# Patient Record
Sex: Male | Born: 1960 | Race: Black or African American | Hispanic: No | Marital: Single | Smoking: Current every day smoker
Health system: Southern US, Community
[De-identification: ages and names within clinical notes are randomized; demographics above are authoritative.]

---

## 2019-07-12 ENCOUNTER — Other Ambulatory Visit: Payer: Self-pay

## 2019-07-12 ENCOUNTER — Emergency Department (HOSPITAL_COMMUNITY)
Admission: EM | Admit: 2019-07-12 | Discharge: 2019-07-13 | Disposition: A | Discharge status: Home / Self Care | Payer: Self-pay | Attending: Emergency Medicine | Admitting: Emergency Medicine

## 2019-07-12 ENCOUNTER — Emergency Department (HOSPITAL_COMMUNITY): Payer: Self-pay

## 2019-07-12 ENCOUNTER — Encounter (HOSPITAL_COMMUNITY): Payer: Self-pay | Admitting: Emergency Medicine

## 2019-07-12 DIAGNOSIS — F17200 Nicotine dependence, unspecified, uncomplicated: Secondary | ICD-10-CM | POA: Insufficient documentation

## 2019-07-12 DIAGNOSIS — R55 Syncope and collapse: Secondary | ICD-10-CM

## 2019-07-12 DIAGNOSIS — R05 Cough: Secondary | ICD-10-CM

## 2019-07-12 DIAGNOSIS — Z20828 Contact with and (suspected) exposure to other viral communicable diseases: Secondary | ICD-10-CM | POA: Insufficient documentation

## 2019-07-12 DIAGNOSIS — J069 Acute upper respiratory infection, unspecified: Secondary | ICD-10-CM | POA: Insufficient documentation

## 2019-07-12 DIAGNOSIS — N390 Urinary tract infection, site not specified: Secondary | ICD-10-CM | POA: Insufficient documentation

## 2019-07-12 DIAGNOSIS — N189 Chronic kidney disease, unspecified: Secondary | ICD-10-CM | POA: Insufficient documentation

## 2019-07-12 LAB — BASIC METABOLIC PANEL
Anion gap: 8 (ref 5–15)
BUN: 35 mg/dL — ABNORMAL HIGH (ref 6–20)
CO2: 22 mmol/L (ref 22–32)
Calcium: 9.1 mg/dL (ref 8.9–10.3)
Chloride: 107 mmol/L (ref 98–111)
Creatinine, Ser: 3.12 mg/dL — ABNORMAL HIGH (ref 0.61–1.24)
GFR calc Af Amer: 25 mL/min — ABNORMAL LOW (ref >60)
GFR calc non Af Amer: 21 mL/min — ABNORMAL LOW (ref >60)
Glucose, Bld: 91 mg/dL (ref 70–99)
Potassium: 4.8 mmol/L (ref 3.5–5.1)
Sodium: 137 mmol/L (ref 135–145)

## 2019-07-12 LAB — CBC
HCT: 36.3 % — ABNORMAL LOW (ref 39.0–52.0)
Hemoglobin: 11.7 g/dL — ABNORMAL LOW (ref 13.0–17.0)
MCH: 31.9 pg (ref 26.0–34.0)
MCHC: 32.2 g/dL (ref 30.0–36.0)
MCV: 98.9 fL (ref 80.0–100.0)
Platelets: 342 10*3/uL (ref 150–400)
RBC: 3.67 MIL/uL — ABNORMAL LOW (ref 4.22–5.81)
RDW: 13 % (ref 11.5–15.5)
WBC: 7.1 10*3/uL (ref 4.0–10.5)
nRBC: 0 % (ref 0.0–0.2)

## 2019-07-12 MED ORDER — SODIUM CHLORIDE 0.9% FLUSH: 3.0000 mL | Freq: Once | INTRAVENOUS | Status: DC

## 2019-07-12 MED ORDER — SODIUM CHLORIDE 0.9 % IV BOLUS
1000.0000 mL | Freq: Once | INTRAVENOUS | Status: AC
  Administered 2019-07-13: 1000 mL via INTRAVENOUS

## 2019-07-12 MED ORDER — FENTANYL CITRATE (PF) 100 MCG/2ML IJ SOLN
50.0000 ug | Freq: Once | INTRAMUSCULAR | Status: AC
  Administered 2019-07-13: 50 ug via INTRAVENOUS
  Filled 2019-07-12: qty 2

## 2019-07-12 NOTE — ED Notes (Signed)
Pt. Son to the desk to let us know that his dad has left due to the wait and being overly agitated. We apologized for the wait.

## 2019-07-12 NOTE — ED Notes (Signed)
Pt again walked from [place] yelling and cursing regarding extended wait times. Pt seen walking across parking lot with fast steady gait.

## 2019-07-12 NOTE — ED Provider Notes (Addendum)
[facility] EMERGENCY DEPARTMENT Provider Note   CSN: [id number] Arrival date & time: 07/12/19  1923     History   Chief Complaint Chief Complaint  Patient presents with  . Loss of Consciousness  . Weakness    HPI Jim Elliott. is a 58 y.o. male.     Patient to ED with symptoms of body aches, cough, SOB, nasal congestion, headache without fever since yesterday. Tonight he had a syncopal episode, family in the next room who reports he "went in and out for a bit". No seizure activity. He had some "dry heaves". No chest pain. Daughter at bedside reports this is the 3rd syncopal episode he has had since June of this year. The patient reports he is not associated with any primary care. He has a history of peritoneal dialysis that he stopped on his own 20 years ago after "God healed my kidneys". He has had little medical follow up since that time. He denies known exposure to COVID positive people.   The history is provided by the patient. No language interpreter was used.  Loss of Consciousness Associated symptoms: headaches, nausea, shortness of breath and weakness   Associated symptoms: no chest pain and no diaphoresis   Weakness Associated symptoms: cough, headaches, myalgias, nausea, shortness of breath and syncope   Associated symptoms: no abdominal pain and no chest pain     History reviewed. No pertinent past medical history.  There are no active problems to display for this patient.   History reviewed. No pertinent surgical history.      Home Medications    Prior to Admission medications   Not on File    Family History History reviewed. No pertinent family history.  Social History Social History   Tobacco Use  . Smoking status: Current Every Day Smoker    Packs/day: 0.25  . Smokeless tobacco: Never Used  Substance Use Topics  . Alcohol use: Yes    Comment: occasional  . Drug use: Yes    Types: Marijuana     Allergies    Patient has no known allergies.   Review of Systems Review of Systems  Constitutional: Positive for appetite change. Negative for diaphoresis.  HENT: Positive for congestion. Negative for sore throat.   Respiratory: Positive for cough and shortness of breath.   Cardiovascular: Positive for syncope. Negative for chest pain.  Gastrointestinal: Positive for nausea. Negative for abdominal pain.  Musculoskeletal: Positive for myalgias.  Skin: Negative.   Neurological: Positive for syncope, weakness and headaches.     Physical Exam Updated Vital Signs BP (!) 161/107   Pulse 75   Temp 97.9 F (36.6 C) (Oral)   Resp 12   SpO2 100%   Physical Exam Vitals signs and nursing note reviewed.  Constitutional:      General: He is not in acute distress.    Appearance: Normal appearance. He is well-developed. He is not toxic-appearing.  HENT:     Head: Normocephalic.     Nose: Congestion present.  Eyes:     Conjunctiva/sclera: Conjunctivae normal.  Neck:     Musculoskeletal: Normal range of motion and neck supple.  Cardiovascular:     Rate and Rhythm: Normal rate and regular rhythm.     Heart sounds: No murmur.  Pulmonary:     Effort: Pulmonary effort is normal.     Breath sounds: Normal breath sounds. No wheezing, rhonchi or rales.     Comments: Actively coughing during exam.  Chest:  Chest wall: No tenderness.  Abdominal:     General: Bowel sounds are normal.     Palpations: Abdomen is soft.     Tenderness: There is no abdominal tenderness. There is no guarding or rebound.  Musculoskeletal: Normal range of motion.     Right lower leg: No edema.     Left lower leg: No edema.  Skin:    General: Skin is warm and dry.     Findings: No rash.  Neurological:     Mental Status: He is alert and oriented to person, place, and time.      ED Treatments / Results  Labs (all labs ordered are listed, but only abnormal results are displayed) Labs Reviewed  BASIC METABOLIC PANEL  - Abnormal; Notable for the following components:      Result Value   BUN 35 (*)    Creatinine, Ser 3.12 (*)    GFR calc non Af Amer 21 (*)    GFR calc Af Amer 25 (*)    All other components within normal limits  CBC - Abnormal; Notable for the following components:   RBC 3.67 (*)    Hemoglobin 11.7 (*)    HCT 36.3 (*)    All other components within normal limits  SARS CORONAVIRUS 2 (HOSPITAL ORDER, [facility])  URINALYSIS, ROUTINE W REFLEX MICROSCOPIC  HEPATIC FUNCTION PANEL  CBG MONITORING, ED  TROPONIN I (HIGH SENSITIVITY)    EKG None  Radiology No results found.  Procedures Procedures (including critical care time)  Medications Ordered in ED Medications  sodium chloride flush (NS) 0.9 % injection 3 mL (3 mLs Intravenous Not Given 07/12/19 2240)  sodium chloride 0.9 % bolus 1,000 mL (has no administration in time range)  fentaNYL (SUBLIMAZE) injection 50 mcg (has no administration in time range)     Initial Impression / Assessment and Plan / ED Course  I have reviewed the triage vital signs and the nursing notes.  Pertinent labs & imaging results that were available during my care of the patient were reviewed by me and considered in my medical decision making (see chart for details).        Patient to ED after syncopal episode tonight with 2 day history of afebrile cough, SOB, congestion, headache, body aches.   Patient appears uncomfortable but nontoxic. VSS, no hypoxia. Actively coughing. Labs/imaging pending, including COVID-19 based on symptoms.   Tests are reassuring: COVID negative, CXR negative, no leukocytosis. Cr 3.12, BUN 35 with known history of renal failure and, per patient, "abnormal kidney tests all the time". Feel this is chronic in nature. REviewed with dr. Wilson Singer.   UA shows evidence of infection. Patient denies rectal discomfort or previous prostate problems. Doubt acute prostatitis. Patient denies urinary symptoms or  possibility of STD, denies penile discharge. Will treat with Zithromax, Rocephin in ED and discharge home on Cipro, renal dosing. Will provide resources for PCP, renal.   Final Clinical Impressions(s) / ED Diagnoses   Final diagnoses:  None   1. URI 2. UTI 3. Chronic Renal failure  ED Discharge Orders    None       Charlann Lange, PA-C 07/13/19 0234    Charlann Lange, PA-C 07/13/19 0234    Virgel Manifold, MD 07/13/19 1428

## 2019-07-12 NOTE — ED Notes (Signed)
Pt returned for treatment, pt now calm and agrees to have no cursing or yelling while in ED

## 2019-07-12 NOTE — ED Triage Notes (Signed)
Pt from home via [facility], son heard a thud upstairs tonight and reports pt lost consciousness. Pt reports gen. Weakness and fatigue since yesterday with SOB and a non-productive cough that began this morning.

## 2019-07-12 NOTE — ED Notes (Addendum)
Pts son is waiting in parking lot. [phone], please call when he can be in room.

## 2019-07-12 NOTE — ED Notes (Signed)
Pt walked from [place] with steady gait cursing stating he is no longer waiting, Pt immediately returned after speaking with his son in parking lot, pt very angry, cursing loudly in [place] states he does not want to stay but returned to his WC.

## 2019-07-13 LAB — URINALYSIS, ROUTINE W REFLEX MICROSCOPIC
Bilirubin Urine: NEGATIVE
Glucose, UA: NEGATIVE mg/dL
Ketones, ur: NEGATIVE mg/dL
Nitrite: NEGATIVE
Protein, ur: >=300 mg/dL — AB
Specific Gravity, Urine: 1.008 (ref 1.005–1.030)
WBC, UA: >50 WBC/hpf — ABNORMAL HIGH (ref 0–5)
pH: 6 (ref 5.0–8.0)

## 2019-07-13 LAB — HEPATIC FUNCTION PANEL
ALT: 11 U/L (ref 0–44)
AST: 16 U/L (ref 15–41)
Albumin: 3 g/dL — ABNORMAL LOW (ref 3.5–5.0)
Alkaline Phosphatase: 68 U/L (ref 38–126)
Bilirubin, Direct: <0.1 mg/dL (ref 0.0–0.2)
Total Bilirubin: 0.6 mg/dL (ref 0.3–1.2)
Total Protein: 6 g/dL — ABNORMAL LOW (ref 6.5–8.1)

## 2019-07-13 LAB — SARS CORONAVIRUS 2 BY RT PCR (HOSPITAL ORDER, PERFORMED IN ~~LOC~~ HOSPITAL LAB): SARS Coronavirus 2: NEGATIVE

## 2019-07-13 LAB — TROPONIN I (HIGH SENSITIVITY)
Troponin I (High Sensitivity): 5 ng/L (ref <18)
Troponin I (High Sensitivity): 5 ng/L (ref <18)

## 2019-07-13 MED ORDER — SODIUM CHLORIDE 0.9 % IV BOLUS
500.0000 mL | Freq: Once | INTRAVENOUS | Status: AC
  Administered 2019-07-13: 500 mL via INTRAVENOUS

## 2019-07-13 MED ORDER — CEFTRIAXONE SODIUM 250 MG IJ SOLR
250.0000 mg | Freq: Once | INTRAMUSCULAR | Status: AC
  Administered 2019-07-13: 250 mg via INTRAMUSCULAR
  Filled 2019-07-13: qty 250

## 2019-07-13 MED ORDER — HYDROCOD POLST-CPM POLST ER 10-8 MG/5ML PO SUER
5.0000 mL | Freq: Once | ORAL | Status: AC
  Administered 2019-07-13: 5 mL via ORAL
  Filled 2019-07-13: qty 5

## 2019-07-13 MED ORDER — HYDROCOD POLST-CPM POLST ER 10-8 MG/5ML PO SUER: 5.0000 mL | Freq: Two times a day (BID) | ORAL | 0 refills | Status: DC

## 2019-07-13 MED ORDER — ONDANSETRON HCL 4 MG/2ML IJ SOLN
4.0000 mg | Freq: Once | INTRAMUSCULAR | Status: AC
  Administered 2019-07-13: 4 mg via INTRAVENOUS
  Filled 2019-07-13: qty 2

## 2019-07-13 MED ORDER — CIPROFLOXACIN HCL 250 MG PO TABS: 250.0000 mg | ORAL_TABLET | Freq: Two times a day (BID) | ORAL | 0 refills | Status: DC

## 2019-07-13 MED ORDER — STERILE WATER FOR INJECTION IJ SOLN
INTRAMUSCULAR | Status: AC
  Filled 2019-07-13: qty 10

## 2019-07-13 MED ORDER — AZITHROMYCIN 250 MG PO TABS
1000.0000 mg | ORAL_TABLET | Freq: Once | ORAL | Status: AC
  Administered 2019-07-13: 1000 mg via ORAL
  Filled 2019-07-13: qty 4

## 2019-07-13 NOTE — ED Notes (Signed)
Discharged without N/V, and denies pain. Patient able to ambulate. Assisted by son to drive home.

## 2019-07-13 NOTE — ED Notes (Signed)
Fluid challenge was done and was able to tolerate it. No N/V observed as of the moment.

## 2019-07-13 NOTE — ED Notes (Signed)
Able to tolerate standing up following several medications given and IV fluid boluses.

## 2019-07-13 NOTE — Discharge Instructions (Addendum)
Follow up with [facility] for management of your kidney disease.   Choose a primary care provider (resources provided in your discharge papers) for management of routine health concerns.   Take medications as prescribed. Return to the ED with any worsening symptoms.

## 2019-07-13 NOTE — ED Notes (Signed)
Observed to be persistently vomiting and diaphoretic. Relayed to provider, new orders was placed and carried out.

## 2019-07-14 LAB — URINE CULTURE: Culture: >=100000 — AB

## 2019-07-15 ENCOUNTER — Telehealth: Payer: Self-pay | Admitting: *Deleted

## 2019-07-15 NOTE — Telephone Encounter (Signed)
Post ED Visit - Positive Culture Follow-up  Culture report reviewed by antimicrobial stewardship pharmacist: [place] Team []  Elenor Quinones, Pharm.D. []  Heide Guile, Pharm.D., BCPS AQ-ID []  Parks Neptune, Pharm.D., BCPS []  Alycia Rossetti, Pharm.D., BCPS []  [place], Pharm.D., BCPS, AAHIVP []  Legrand Como, Pharm.D., BCPS, AAHIVP []  Salome Arnt, PharmD, BCPS []  Johnnette Gourd, PharmD, BCPS []  Hughes Better, PharmD, BCPS []  Leeroy Cha, PharmD []  Laqueta Linden, PharmD, BCPS []  Albertina Parr, PharmD Agnes Lawrence, [place] Team []  Leodis Sias, PharmD []  Lindell Spar, PharmD []  Royetta Asal, PharmD []  Graylin Shiver, Rph []  Rema Fendt) Glennon Mac, PharmD []  Arlyn Dunning, PharmD []  Netta Cedars, PharmD []  Dia Sitter, PharmD []  Leone Haven, PharmD []  Gretta Arab, PharmD []  Theodis Shove, PharmD []  Peggyann Juba, PharmD []  Reuel Boom, PharmD   Positive urine culture Treated with Ciprofloxacin  HCL, organism sensitive to the same and no further patient follow-up is required at this time.  Harlon Flor [facility] 07/15/2019, 9:47 AM

## 2020-04-19 ENCOUNTER — Encounter (HOSPITAL_COMMUNITY): Payer: Self-pay | Admitting: Emergency Medicine

## 2020-04-19 ENCOUNTER — Emergency Department (HOSPITAL_COMMUNITY)
Admission: EM | Admit: 2020-04-19 | Discharge: 2020-04-19 | Disposition: A | Discharge status: Home / Self Care | Payer: No Typology Code available for payment source | Attending: Emergency Medicine | Admitting: Emergency Medicine

## 2020-04-19 DIAGNOSIS — Z5321 Procedure and treatment not carried out due to patient leaving prior to being seen by health care provider: Secondary | ICD-10-CM | POA: Insufficient documentation

## 2020-04-19 DIAGNOSIS — N189 Chronic kidney disease, unspecified: Secondary | ICD-10-CM | POA: Insufficient documentation

## 2020-04-19 LAB — BASIC METABOLIC PANEL
Anion gap: 10 (ref 5–15)
BUN: 43 mg/dL — ABNORMAL HIGH (ref 6–20)
CO2: 19 mmol/L — ABNORMAL LOW (ref 22–32)
Calcium: 9.2 mg/dL (ref 8.9–10.3)
Chloride: 108 mmol/L (ref 98–111)
Creatinine, Ser: 4.61 mg/dL — ABNORMAL HIGH (ref 0.61–1.24)
GFR calc Af Amer: 15 mL/min — ABNORMAL LOW (ref >60)
GFR calc non Af Amer: 13 mL/min — ABNORMAL LOW (ref >60)
Glucose, Bld: 133 mg/dL — ABNORMAL HIGH (ref 70–99)
Potassium: 4.9 mmol/L (ref 3.5–5.1)
Sodium: 137 mmol/L (ref 135–145)

## 2020-04-19 NOTE — ED Triage Notes (Signed)
I have a appt. In Aug. For my kidneys.

## 2020-04-19 NOTE — ED Notes (Signed)
Pt sated that he has to go pick up his grand kids. Pt has left ED lobby.

## 2020-04-19 NOTE — ED Triage Notes (Signed)
Pt. Stated, I went to [facility] hospital and was told to come to ER cause I have kidney problems. My potassium was not normal and my creatinine was out of normal. She told me my kidneys are only functioning at 14 % .

## 2020-05-20 ENCOUNTER — Other Ambulatory Visit: Payer: Self-pay

## 2020-05-20 ENCOUNTER — Emergency Department (HOSPITAL_COMMUNITY): Payer: No Typology Code available for payment source

## 2020-05-20 ENCOUNTER — Encounter (HOSPITAL_COMMUNITY): Payer: Self-pay

## 2020-05-20 ENCOUNTER — Emergency Department (HOSPITAL_COMMUNITY)
Admission: EM | Admit: 2020-05-20 | Discharge: 2020-05-20 | Disposition: A | Discharge status: Home / Self Care | Payer: No Typology Code available for payment source | Attending: Emergency Medicine | Admitting: Emergency Medicine

## 2020-05-20 DIAGNOSIS — N3001 Acute cystitis with hematuria: Secondary | ICD-10-CM | POA: Insufficient documentation

## 2020-05-20 DIAGNOSIS — F17200 Nicotine dependence, unspecified, uncomplicated: Secondary | ICD-10-CM | POA: Diagnosis not present

## 2020-05-20 DIAGNOSIS — C679 Malignant neoplasm of bladder, unspecified: Secondary | ICD-10-CM | POA: Insufficient documentation

## 2020-05-20 DIAGNOSIS — R109 Unspecified abdominal pain: Secondary | ICD-10-CM | POA: Diagnosis present

## 2020-05-20 DIAGNOSIS — Z20822 Contact with and (suspected) exposure to covid-19: Secondary | ICD-10-CM | POA: Diagnosis not present

## 2020-05-20 LAB — COMPREHENSIVE METABOLIC PANEL
ALT: 12 U/L (ref 0–44)
AST: 15 U/L (ref 15–41)
Albumin: 3.1 g/dL — ABNORMAL LOW (ref 3.5–5.0)
Alkaline Phosphatase: 80 U/L (ref 38–126)
Anion gap: 12 (ref 5–15)
BUN: 34 mg/dL — ABNORMAL HIGH (ref 6–20)
CO2: 16 mmol/L — ABNORMAL LOW (ref 22–32)
Calcium: 9.1 mg/dL (ref 8.9–10.3)
Chloride: 104 mmol/L (ref 98–111)
Creatinine, Ser: 3.46 mg/dL — ABNORMAL HIGH (ref 0.61–1.24)
GFR calc Af Amer: 22 mL/min — ABNORMAL LOW (ref >60)
GFR calc non Af Amer: 19 mL/min — ABNORMAL LOW (ref >60)
Glucose, Bld: 145 mg/dL — ABNORMAL HIGH (ref 70–99)
Potassium: 3.9 mmol/L (ref 3.5–5.1)
Sodium: 132 mmol/L — ABNORMAL LOW (ref 135–145)
Total Bilirubin: 0.8 mg/dL (ref 0.3–1.2)
Total Protein: 7.3 g/dL (ref 6.5–8.1)

## 2020-05-20 LAB — CBC
HCT: 30.6 % — ABNORMAL LOW (ref 39.0–52.0)
Hemoglobin: 9.5 g/dL — ABNORMAL LOW (ref 13.0–17.0)
MCH: 28.6 pg (ref 26.0–34.0)
MCHC: 31 g/dL (ref 30.0–36.0)
MCV: 92.2 fL (ref 80.0–100.0)
Platelets: 474 10*3/uL — ABNORMAL HIGH (ref 150–400)
RBC: 3.32 MIL/uL — ABNORMAL LOW (ref 4.22–5.81)
RDW: 14.3 % (ref 11.5–15.5)
WBC: 21.5 10*3/uL — ABNORMAL HIGH (ref 4.0–10.5)
nRBC: 0 % (ref 0.0–0.2)

## 2020-05-20 LAB — URINALYSIS, ROUTINE W REFLEX MICROSCOPIC
Bilirubin Urine: NEGATIVE
Glucose, UA: NEGATIVE mg/dL
Ketones, ur: NEGATIVE mg/dL
Nitrite: NEGATIVE
Protein, ur: >=300 mg/dL — AB
Specific Gravity, Urine: 1.008 (ref 1.005–1.030)
pH: 5 (ref 5.0–8.0)

## 2020-05-20 LAB — LIPASE, BLOOD: Lipase: 20 U/L (ref 11–51)

## 2020-05-20 LAB — SARS CORONAVIRUS 2 BY RT PCR (HOSPITAL ORDER, PERFORMED IN ~~LOC~~ HOSPITAL LAB): SARS Coronavirus 2: NEGATIVE

## 2020-05-20 MED ORDER — CEPHALEXIN 500 MG PO CAPS
500.0000 mg | ORAL_CAPSULE | Freq: Three times a day (TID) | ORAL | 0 refills | Status: DC
Start: 2020-05-20 — End: 2023-02-13

## 2020-05-20 MED ORDER — SODIUM CHLORIDE 0.9 % IV SOLN
1.0000 g | Freq: Once | INTRAVENOUS | Status: AC
  Administered 2020-05-20: 1 g via INTRAVENOUS
  Filled 2020-05-20: qty 10

## 2020-05-20 MED ORDER — HYDROMORPHONE HCL 1 MG/ML IJ SOLN
1.0000 mg | Freq: Once | INTRAMUSCULAR | Status: AC
  Administered 2020-05-20: 1 mg via INTRAVENOUS
  Filled 2020-05-20: qty 1

## 2020-05-20 MED ORDER — SODIUM CHLORIDE 0.9 % IV BOLUS (SEPSIS)
500.0000 mL | Freq: Once | INTRAVENOUS | Status: AC
  Administered 2020-05-20: 500 mL via INTRAVENOUS

## 2020-05-20 MED ORDER — HYDROCODONE-ACETAMINOPHEN 5-325 MG PO TABS: 1.0000 | ORAL_TABLET | ORAL | 0 refills | Status: DC | PRN

## 2020-05-20 MED ORDER — SODIUM CHLORIDE 0.9 % IV SOLN
1000.0000 mL | INTRAVENOUS | Status: DC
  Administered 2020-05-20: 1000 mL via INTRAVENOUS

## 2020-05-20 MED ORDER — BARIUM SULFATE 2.1 % PO SUSP
ORAL | Status: AC
  Filled 2020-05-20: qty 1

## 2020-05-20 MED ORDER — SODIUM CHLORIDE 0.9% FLUSH: 3.0000 mL | Freq: Once | INTRAVENOUS | Status: DC

## 2020-05-20 NOTE — ED Notes (Signed)
Discharge instructions discussed with pt. Pt verbalized understanding with no questions at this time. Pt to go home with family.  °

## 2020-05-20 NOTE — Discharge Instructions (Signed)
Take the antibiotics as prescribed.  Take pain medication as needed.  Follow-up with your urologist at the [facility] as planned.  Return as needed for worsening symptoms

## 2020-05-20 NOTE — ED Provider Notes (Signed)
[facility] EMERGENCY DEPARTMENT Provider Note   CSN: [id number] Arrival date & time: 05/20/20  1441     History Chief Complaint  Patient presents with  . Abdominal Pain    Jim Elliott. is a 59 y.o. male.  HPI   Patient presents emergency room for evaluation of abdominal pain.  He has history of a mass in his abdomen that is compressing his kidney and ureter.  He has a nephrostomy tube and is scheduled for surgery at the [place].  Patient states in the last day or 2 he has had pain in his lower abdomen that has increased in intensity.  He had nausea and vomiting yesterday but none today.  Patient denies any diarrhea.  He is not having dysuria but he has noted that his urine looks darker than usual.  No known fevers.  No cough.  History reviewed. No pertinent past medical history.  There are no problems to display for this patient.   History reviewed. No pertinent surgical history.     No family history on file.  Social History   Tobacco Use  . Smoking status: Current Every Day Smoker    Packs/day: 0.25  . Smokeless tobacco: Never Used  Substance Use Topics  . Alcohol use: Yes    Comment: occasional  . Drug use: Yes    Types: Marijuana    Home Medications Prior to Admission medications   Medication Sig Start Date End Date Taking? Authorizing Provider  amLODipine (NORVASC) 2.5 MG tablet Take 2.5 mg by mouth daily. 04/24/20  Yes [provider]  magnesium oxide (MAG-OX) 400 (241.3 Mg) MG tablet Take 1 tablet by mouth 2 (two) times daily. 04/24/20  Yes [provider]  cephALEXin (KEFLEX) 500 MG capsule Take 1 capsule (500 mg total) by mouth 3 (three) times daily. 05/20/20   Dorie Rank, MD  chlorpheniramine-HYDROcodone [facility] PENNKINETIC ER) 10-8 MG/5ML SUER Take 5 mLs by mouth 2 (two) times daily. Patient not taking: Reported on 05/20/2020 07/13/19   Charlann Lange, PA-C  ciprofloxacin (CIPRO) 250 MG tablet Take 1 tablet (250 mg  total) by mouth 2 (two) times daily. Patient not taking: Reported on 05/20/2020 07/13/19   Charlann Lange, PA-C  HYDROcodone-acetaminophen (NORCO/VICODIN) 5-325 MG tablet Take 1 tablet by mouth every 4 (four) hours as needed. 05/20/20   Dorie Rank, MD    Allergies    Patient has no known allergies.  Review of Systems   Review of Systems  All other systems reviewed and are negative.   Physical Exam Updated Vital Signs BP 110/71   Pulse 92   Temp 99.1 F (37.3 C) (Oral)   Resp 17   Ht 1.753 m (5\' 9" )   Wt 67.1 kg   SpO2 97%   BMI 21.86 kg/m   Physical Exam Vitals and nursing note reviewed.  Constitutional:      General: He is not in acute distress.    Appearance: He is well-developed.  HENT:     Head: Normocephalic and atraumatic.     Right Ear: External ear normal.     Left Ear: External ear normal.  Eyes:     General: No scleral icterus.       Right eye: No discharge.        Left eye: No discharge.     Conjunctiva/sclera: Conjunctivae normal.  Neck:     Trachea: No tracheal deviation.  Cardiovascular:     Rate and Rhythm: Normal rate and regular rhythm.  Pulmonary:     Effort: Pulmonary effort is normal. No respiratory distress.     Breath sounds: Normal breath sounds. No stridor. No wheezing or rales.  Abdominal:     General: Bowel sounds are normal. There is no distension.     Palpations: Abdomen is soft.     Tenderness: There is generalized abdominal tenderness. There is no guarding or rebound.     Comments: Nephrostomy tube right flank, draining yellow urine  Musculoskeletal:        General: No tenderness.     Cervical back: Neck supple.  Skin:    General: Skin is warm and dry.     Findings: No rash.  Neurological:     Mental Status: He is alert.     Cranial Nerves: No cranial nerve deficit (no facial droop, extraocular movements intact, no slurred speech).     Sensory: No sensory deficit.     Motor: No abnormal muscle tone or seizure activity.      Coordination: Coordination normal.     ED Results / Procedures / Treatments   Labs (all labs ordered are listed, but only abnormal results are displayed) Labs Reviewed  COMPREHENSIVE METABOLIC PANEL - Abnormal; Notable for the following components:      Result Value   Sodium 132 (*)    CO2 16 (*)    Glucose, Bld 145 (*)    BUN 34 (*)    Creatinine, Ser 3.46 (*)    Albumin 3.1 (*)    GFR calc non Af Amer 19 (*)    GFR calc Af Amer 22 (*)    All other components within normal limits  CBC - Abnormal; Notable for the following components:   WBC 21.5 (*)    RBC 3.32 (*)    Hemoglobin 9.5 (*)    HCT 30.6 (*)    Platelets 474 (*)    All other components within normal limits  URINALYSIS, ROUTINE W REFLEX MICROSCOPIC - Abnormal; Notable for the following components:   Color, Urine AMBER (*)    APPearance TURBID (*)    Hgb urine dipstick MODERATE (*)    Protein, ur >=300 (*)    Leukocytes,Ua MODERATE (*)    Bacteria, UA MANY (*)    All other components within normal limits  SARS CORONAVIRUS 2 BY RT PCR (HOSPITAL ORDER, [facility])  URINE CULTURE  LIPASE, BLOOD    EKG None  Radiology CT ABDOMEN PELVIS WO CONTRAST  Result Date: 05/20/2020 CLINICAL DATA:  Diverticulitis, urinary tract infection EXAM: CT ABDOMEN AND PELVIS WITHOUT CONTRAST TECHNIQUE: Multidetector CT imaging of the abdomen and pelvis was performed following the standard protocol without IV contrast. COMPARISON:  None. FINDINGS: Lower chest: No acute pleural or parenchymal lung disease. Hepatobiliary: No focal liver abnormality is seen. No gallstones, gallbladder wall thickening, or biliary dilatation. Pancreas: Unremarkable. No pancreatic ductal dilatation or surrounding inflammatory changes. Spleen: Normal in size without focal abnormality. Adrenals/Urinary Tract: There is a right-sided percutaneous nephrostomy tube coiled within the right renal pelvis. No right-sided calculi or obstructive  uropathy. The left kidney demonstrates mild cortical atrophy, with no focal finding. There is a large mass along the posterior wall the bladder measuring 6.8 x 9.6 cm, consistent with malignancy. The adrenals are unremarkable. Stomach/Bowel: No bowel obstruction or ileus. No wall thickening or inflammatory change. Normal appendix is seen within the right lower quadrant. Portions of distal jejunum extend into a left inguinal hernia, without evidence of incarceration or bowel  ischemia. Vascular/Lymphatic: There are borderline enlarged retroperitoneal lymph nodes, measuring up to 9 mm in the left para-aortic distribution. Pathologic iliac chain adenopathy most pronounced on the right, measuring up to 19 mm in short axis reference image 68/3. Findings are compatible with metastatic disease in light of large bladder mass. No significant atherosclerosis. Reproductive: Prostate is unremarkable. Other: No free fluid or free gas. Left inguinal hernia contains a portion of the distal jejunum without evidence of obstruction, incarceration, or strangulation. Musculoskeletal: There are no acute or destructive bony lesions. Symmetrical bilateral hip osteoarthritis is noted. Reconstructed images demonstrate no additional findings. IMPRESSION: 1. Large bladder mass, consistent with malignancy. 2. Right-sided percutaneous nephrostomy tube, with decompressed right renal collecting system. 3. Retroperitoneal lymphadenopathy, greatest in the iliac distributions, consistent with metastatic disease. 4. Left inguinal hernia containing a portion of the distal jejunum without evidence of obstruction, incarceration, or strangulation. Electronically Signed   By: Randa Ngo M.D.   On: 05/20/2020 20:27    Procedures Procedures (including critical care time)  Medications Ordered in ED Medications  sodium chloride flush (NS) 0.9 % injection 3 mL (3 mLs Intravenous Not Given 05/20/20 2008)  sodium chloride 0.9 % bolus 500 mL (0 mLs  Intravenous Stopped 05/20/20 1832)    Followed by  0.9 %  sodium chloride infusion (1,000 mLs Intravenous New Bag/Given 05/20/20 1808)  HYDROmorphone (DILAUDID) injection 1 mg (1 mg Intravenous Given 05/20/20 1700)  cefTRIAXone (ROCEPHIN) 1 g in sodium chloride 0.9 % 100 mL IVPB (0 g Intravenous Stopped 05/20/20 1801)  Barium Sulfate 2.1 % SUSP (  Given by Other 05/20/20 2010)    ED Course  I have reviewed the triage vital signs and the nursing notes.  Pertinent labs & imaging results that were available during my care of the patient were reviewed by me and considered in my medical decision making (see chart for details).  Clinical Course as of May 20 2057  Thu May 20, 2020  1637 Urinalysis is abnormal.  Suggestive of a urinary tract infection   [JK]  1637 Renal function is chronically abnormal.  Not significantly changed from baseline   [JK]  1638 White blood cell count shows a significant leukocytosis.  Hemoglobin decreased from prior.   [JK]  2038 CT scan findings reviewed.  Bladder mass noted. No diverticulitis, colitis.   [JK]  2054 Patient states he is feeling better.  Discussed options of being admitted for pain management and treating his urinary tract infection.  Patient states he is feeling better and would like to be discharged.   [JK]    Clinical Course User Index [JK] Dorie Rank, MD   MDM Rules/Calculators/A&P                          Pt presented with abdominal pain.  Known history of bladder can.  UA suggested a uti.    With his pain and elevated wbc count, CT scan obtained.  Pt stated on abx for his uti.  CT scan shows known tumor.  No diverticulitis or other abnormality.  Discussed admission with pt but he would like to go home.  No fever, vital stable.  No signs of sepsis.  Will dc home with abx and pain meds.   Follow up with urology. Final Clinical Impression(s) / ED Diagnoses Final diagnoses:  Malignant neoplasm of urinary bladder, unspecified site [facility])  Acute  cystitis with hematuria    Rx / DC Orders ED Discharge Orders  Ordered    HYDROcodone-acetaminophen (NORCO/VICODIN) 5-325 MG tablet  Every 4 hours PRN     Discontinue  Reprint     05/20/20 2057    cephALEXin (KEFLEX) 500 MG capsule  3 times daily     Discontinue  Reprint     05/20/20 2057           Dorie Rank, MD 05/20/20 2100

## 2020-05-20 NOTE — ED Triage Notes (Signed)
Pt arrives to ED w/ 6/10 generalized abdominal pain. Pt has nephrostomy tube. Pt endorses n/v. Pt denies diarrhea. Pt denies chest pain, sob.

## 2020-05-20 NOTE — ED Notes (Signed)
Pt transported to CT ?

## 2020-05-22 LAB — URINE CULTURE: Culture: >=100000 — AB

## 2020-05-23 ENCOUNTER — Telehealth (HOSPITAL_BASED_OUTPATIENT_CLINIC_OR_DEPARTMENT_OTHER): Payer: Self-pay | Admitting: Emergency Medicine

## 2020-05-23 NOTE — Progress Notes (Signed)
ED Antimicrobial Stewardship Positive Culture Follow Up   Jim Elliott. is an 59 y.o. male who presented to [facility] on 05/20/2020 with a chief complaint of abdominal pain, nausea and vomiting Chief Complaint  Patient presents with  . Abdominal Pain    Recent Results (from the past 720 hour(s))  Urine Culture     Status: Abnormal   Collection Time: 05/20/20  3:22 PM   Specimen: Urine, Random  Result Value Ref Range Status   Specimen Description URINE, RANDOM  Final   Special Requests   Final    NONE Performed at [facility], [address], [place], [place] [address]    Culture >=100,000 COLONIES/mL ENTEROBACTER CLOACAE (A)  Final   Report Status 05/22/2020 FINAL  Final   Organism ID, Bacteria ENTEROBACTER CLOACAE (A)  Final      Susceptibility   Enterobacter cloacae - MIC*    CEFAZOLIN >=64 RESISTANT Resistant     CIPROFLOXACIN <=0.25 SENSITIVE Sensitive     GENTAMICIN <=1 SENSITIVE Sensitive     IMIPENEM <=0.25 SENSITIVE Sensitive     NITROFURANTOIN 64 INTERMEDIATE Intermediate     TRIMETH/SULFA <=20 SENSITIVE Sensitive     PIP/TAZO <=4 SENSITIVE Sensitive     * >=100,000 COLONIES/mL ENTEROBACTER CLOACAE  SARS Coronavirus 2 by RT PCR (hospital order, performed in [place] [facility] hospital lab) Nasopharyngeal Nasopharyngeal Swab     Status: None   Collection Time: 05/20/20  6:34 PM   Specimen: Nasopharyngeal Swab  Result Value Ref Range Status   SARS Coronavirus 2 NEGATIVE NEGATIVE Final    Comment: (NOTE) SARS-CoV-2 target nucleic acids are NOT DETECTED.  The SARS-CoV-2 RNA is generally detectable in upper and lower respiratory specimens during the acute phase of infection. The lowest concentration of SARS-CoV-2 viral copies this assay can detect is 250 copies / mL. A negative result does not preclude SARS-CoV-2 infection and should not be used as the sole basis for treatment or other patient management decisions.  A negative result may occur with improper  specimen collection / handling, submission of specimen other than nasopharyngeal swab, presence of viral mutation(s) within the areas targeted by this assay, and inadequate number of viral copies (<250 copies / mL). A negative result must be combined with clinical observations, patient history, and epidemiological information.  Fact Sheet for Patients:   StrictlyIdeas.no  Fact Sheet for Healthcare Providers: BankingDealers.co.za  This test is not yet approved or  cleared by the [place] FDA and has been authorized for detection and/or diagnosis of SARS-CoV-2 by FDA under an Emergency Use Authorization (EUA).  This EUA will remain in effect (meaning this test can be used) for the duration of the COVID-19 declaration under Section 564(b)(1) of the Act, 21 U.S.C. section 360bbb-3(b)(1), unless the authorization is terminated or revoked sooner.  Performed at [facility], [place] [address] [place], [place] [address]     [x]  Treated with cephalexin, organism resistant to prescribed antimicrobial   New antibiotic prescription: Bactrim SS twice daily for 7 days. Adjusted for renal function.   ED Provider: Levonne Hubert, PharmD PGY1 Pharmacy Resident 05/23/2020 10:48 AM

## 2020-05-23 NOTE — Telephone Encounter (Signed)
Post ED Visit - Positive Culture Follow-up: Unsuccessful Patient Follow-up  Culture assessed and recommendations reviewed by:  []  Elenor Quinones, Pharm.D. []  Heide Guile, Pharm.D., BCPS AQ-ID []  Parks Neptune, Pharm.D., BCPS []  Alycia Rossetti, Pharm.D., BCPS []  Alcova, Pharm.D., BCPS, AAHIVP []  Legrand Como, Pharm.D., BCPS, AAHIVP [x]  Norina Buzzard, PharmD []  Vincenza Hews, PharmD, BCPS  Positive urine culture  []  Patient discharged without antimicrobial prescription and treatment is now indicated [x]  Organism is resistant to prescribed ED discharge antimicrobial []  Patient with positive blood cultures   Unable to contact patient at phone number on file, letter will be sent to address on file.  Plan: Bactrim TMP80/SMX 400 q 12 hours x seven days - [facility]  Milus Mallick 05/23/2020, 3:18 PM

## 2020-06-07 ENCOUNTER — Emergency Department (HOSPITAL_COMMUNITY)
Admission: EM | Admit: 2020-06-07 | Discharge: 2020-06-08 | Disposition: A | Discharge status: Home / Self Care | Payer: No Typology Code available for payment source | Attending: Emergency Medicine | Admitting: Emergency Medicine

## 2020-06-07 ENCOUNTER — Encounter (HOSPITAL_COMMUNITY): Payer: Self-pay | Admitting: Emergency Medicine

## 2020-06-07 ENCOUNTER — Other Ambulatory Visit: Payer: Self-pay

## 2020-06-07 DIAGNOSIS — M25512 Pain in left shoulder: Secondary | ICD-10-CM | POA: Diagnosis not present

## 2020-06-07 DIAGNOSIS — M25511 Pain in right shoulder: Secondary | ICD-10-CM | POA: Diagnosis not present

## 2020-06-07 DIAGNOSIS — M25561 Pain in right knee: Secondary | ICD-10-CM | POA: Insufficient documentation

## 2020-06-07 DIAGNOSIS — Z5321 Procedure and treatment not carried out due to patient leaving prior to being seen by health care provider: Secondary | ICD-10-CM | POA: Diagnosis not present

## 2020-06-07 DIAGNOSIS — M25562 Pain in left knee: Secondary | ICD-10-CM | POA: Insufficient documentation

## 2020-06-07 NOTE — ED Notes (Signed)
Called pt and no answer.  

## 2020-06-07 NOTE — ED Triage Notes (Signed)
Pt c/o mouth swollen since this morning and c/o bilateral knee and shoulder pain, pt denies any injury. Pt is able to talk on complete sentences not swollen noticed on triage.

## 2020-12-11 IMAGING — CT CT ABD-PELV W/O CM
2 of 4 series · 15 of 46 positions shown, 17 images · non-contrast
Comparison: None.

CLINICAL DATA: Diverticulitis, urinary tract infection

EXAM:
CT ABDOMEN AND PELVIS WITHOUT CONTRAST
TECHNIQUE: Multidetector CT imaging of the abdomen and pelvis was performed
following the standard protocol without IV contrast.

[Series 3: ap without · axial · non-contrast · 0.79mm/px · z∈[-390,+24]mm · 12 of 95 slices shown, 14 images]
[im 6/95  soft-tissue]
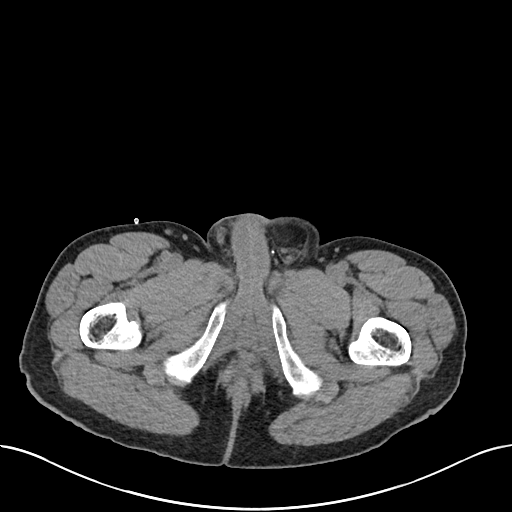
[im 6/95  bone]
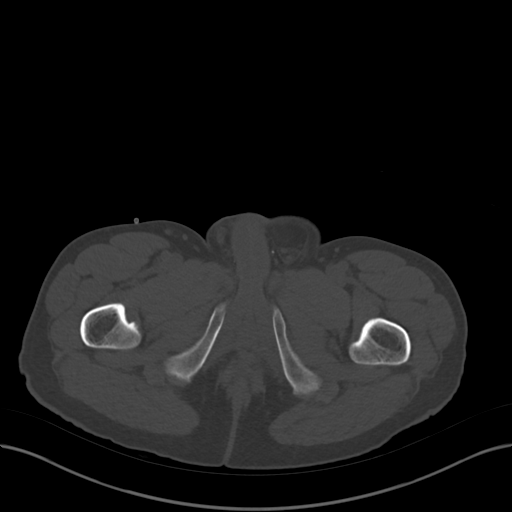
[im 17/95  soft-tissue]
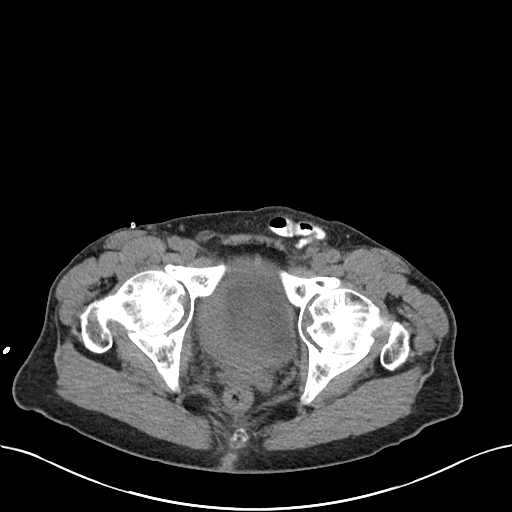
[im 23/95  soft-tissue]
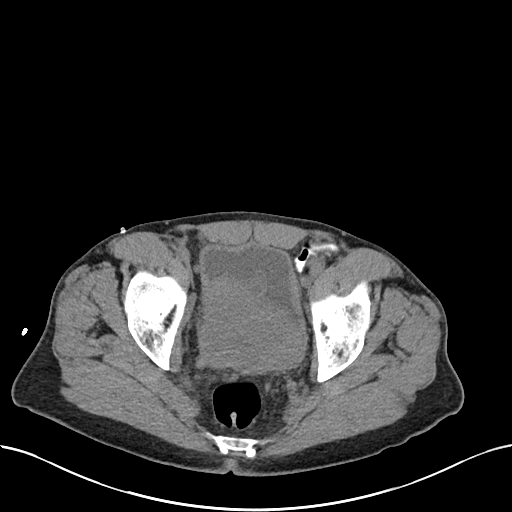
[im 28/95  soft-tissue]
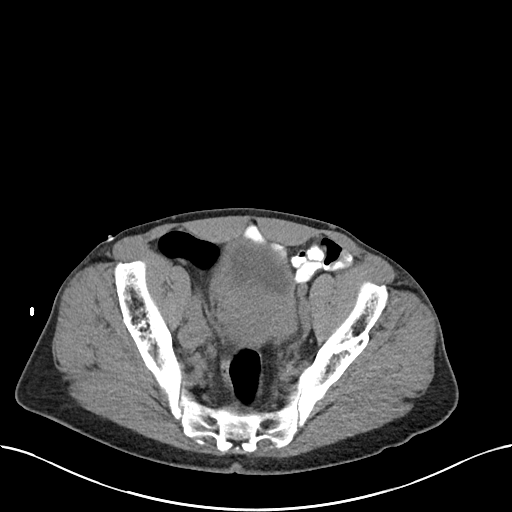
[im 39/95  soft-tissue]
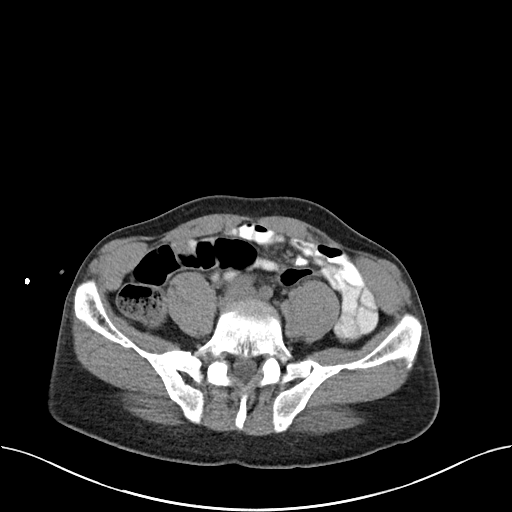
[im 45/95  soft-tissue]
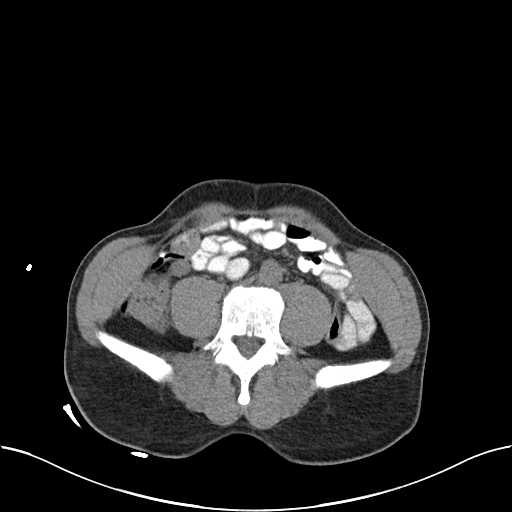
[im 50/95  soft-tissue]
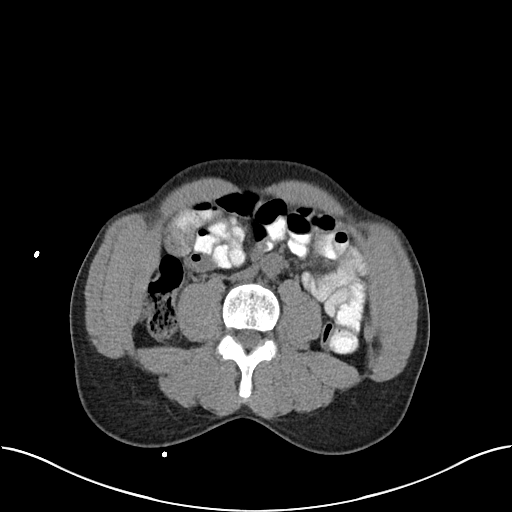
[im 61/95  soft-tissue]
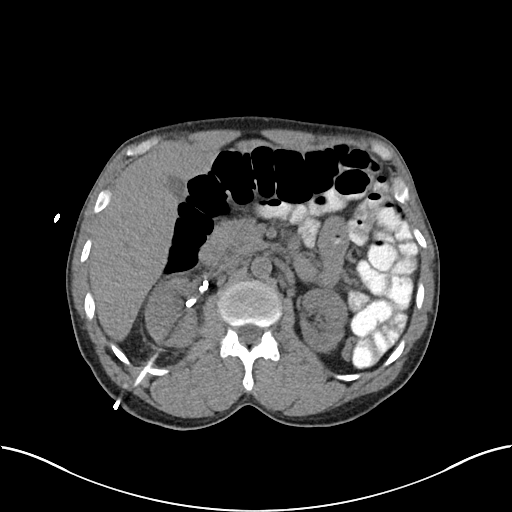
[im 67/95  soft-tissue]
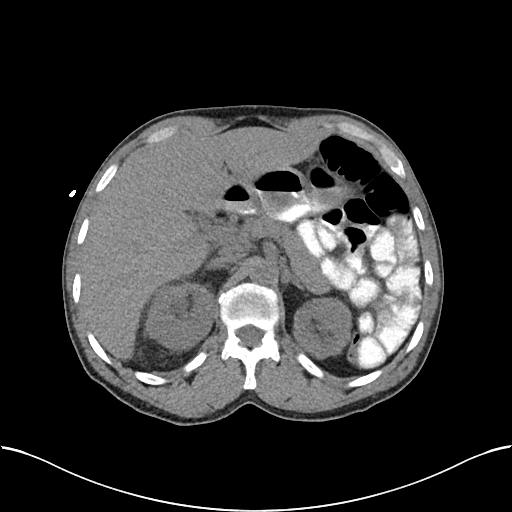
[im 67/95  bone]
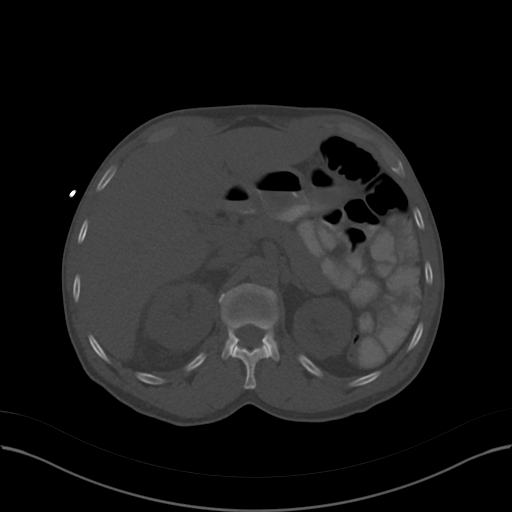
[im 72/95  soft-tissue]
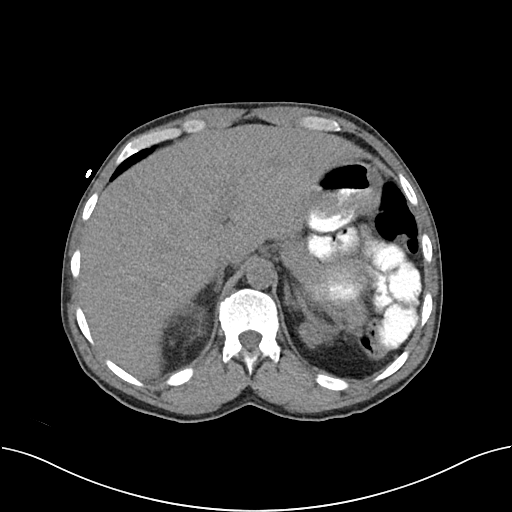
[im 83/95  soft-tissue]
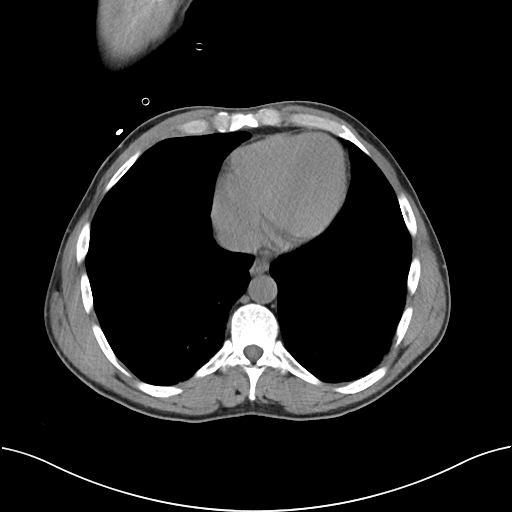
[im 89/95  soft-tissue]
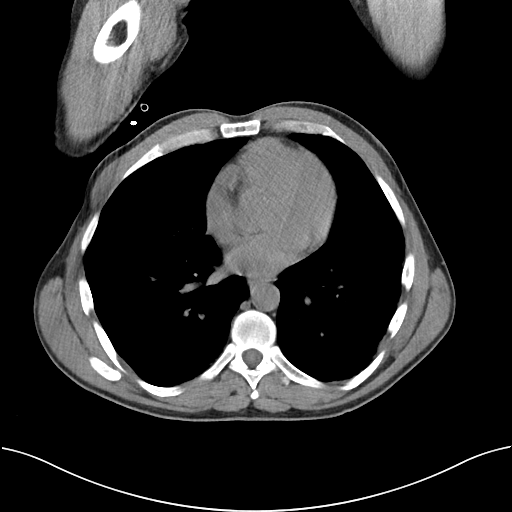

[Series 6: cor · coronal · 0.71mm/px · 3 of 123 slices shown]
[im 41/123  soft-tissue]
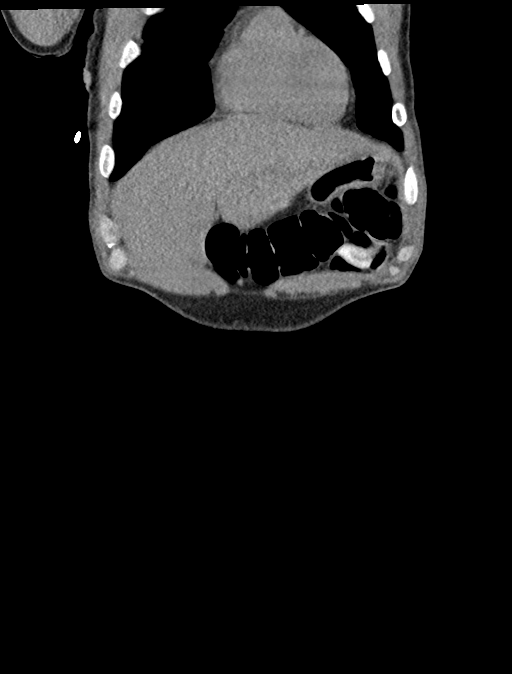
[im 55/123  soft-tissue]
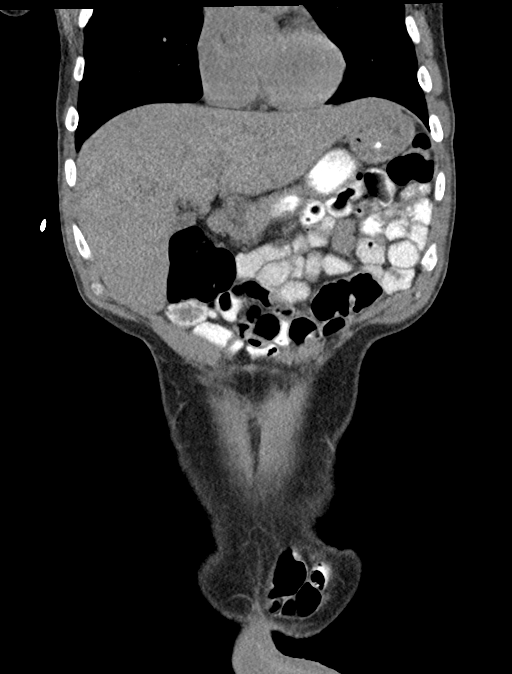
[im 68/123  soft-tissue]
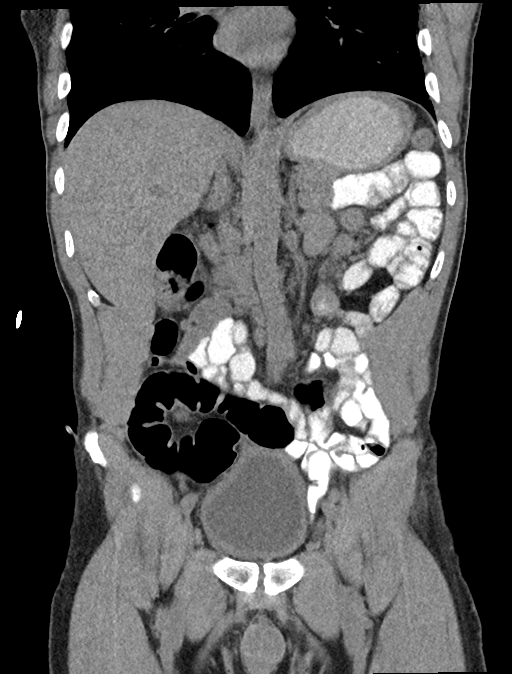

[15 of 46 positions shown; findings below may reference images not displayed]

FINDINGS: Lower chest: No acute pleural or parenchymal lung disease.

Hepatobiliary: No focal liver abnormality is seen. No gallstones,
gallbladder wall thickening, or biliary dilatation.

Pancreas: Unremarkable. No pancreatic ductal dilatation or
surrounding inflammatory changes.

Spleen: Normal in size without focal abnormality.

Adrenals/Urinary Tract: There is a right-sided percutaneous
nephrostomy tube coiled within the right renal pelvis. No
right-sided calculi or obstructive uropathy.

The left kidney demonstrates mild cortical atrophy, with no focal
finding.

There is a large mass along the posterior wall the bladder measuring
6.8 x 9.6 cm, consistent with malignancy.

The adrenals are unremarkable.

Stomach/Bowel: No bowel obstruction or ileus. No wall thickening or
inflammatory change. Normal appendix is seen within the right lower
quadrant.

Portions of distal jejunum extend into a left inguinal hernia,
without evidence of incarceration or bowel ischemia.

Vascular/Lymphatic: There are borderline enlarged retroperitoneal
lymph nodes, measuring up to 9 mm in the left para-aortic
distribution. Pathologic iliac chain adenopathy most pronounced on
the right, measuring up to 19 mm in short axis reference image 68/3.
Findings are compatible with metastatic disease in light of large
bladder mass.

No significant atherosclerosis.

Reproductive: Prostate is unremarkable.

Other: No free fluid or free gas. Left inguinal hernia contains a
portion of the distal jejunum without evidence of obstruction,
incarceration, or strangulation.

Musculoskeletal: There are no acute or destructive bony lesions.
Symmetrical bilateral hip osteoarthritis is noted. Reconstructed
images demonstrate no additional findings.
IMPRESSION: 1. Large bladder mass, consistent with malignancy.
2. Right-sided percutaneous nephrostomy tube, with decompressed
right renal collecting system.
3. Retroperitoneal lymphadenopathy, greatest in the iliac
distributions, consistent with metastatic disease.
4. Left inguinal hernia containing a portion of the distal jejunum
without evidence of obstruction, incarceration, or strangulation.

## 2020-12-28 DEATH — deceased
